# Patient Record
Sex: Female | Born: 2005 | Race: Black or African American | Hispanic: No | Marital: Single | Smoking: Never smoker
Health system: Southern US, Community
[De-identification: ages and names within clinical notes are randomized; demographics above are authoritative.]

---
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
## 2023-01-04 ENCOUNTER — Telehealth: Payer: 59 | Admitting: Physician Assistant

## 2023-01-04 DIAGNOSIS — H6502 Acute serous otitis media, left ear: Secondary | ICD-10-CM | POA: Diagnosis not present

## 2023-01-04 MED ORDER — NEOMYCIN-POLYMYXIN-HC 3.5-10000-1 OT SUSP: 3.0000 [drp] | Freq: Four times a day (QID) | OTIC | 0 refills | Status: AC

## 2023-01-04 MED ORDER — AMOXICILLIN-POT CLAVULANATE 875-125 MG PO TABS: 1.0000 | ORAL_TABLET | Freq: Two times a day (BID) | ORAL | 0 refills | Status: AC

## 2023-01-04 NOTE — Progress Notes (Signed)
